# Patient Record
Sex: Female | Born: 2005 | Race: White | Hispanic: No | Marital: Single | Smoking: Never smoker
Health system: Southern US, Community
[De-identification: ages and names within clinical notes are randomized; demographics above are authoritative.]

---
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
## 2023-03-27 ENCOUNTER — Other Ambulatory Visit (HOSPITAL_BASED_OUTPATIENT_CLINIC_OR_DEPARTMENT_OTHER): Payer: Self-pay

## 2023-03-27 DIAGNOSIS — N946 Dysmenorrhea, unspecified: Secondary | ICD-10-CM | POA: Diagnosis not present

## 2023-03-27 DIAGNOSIS — Z30011 Encounter for initial prescription of contraceptive pills: Secondary | ICD-10-CM | POA: Diagnosis not present

## 2023-03-27 MED ORDER — LEVONORGEST-ETH ESTRAD 91-DAY 0.15-0.03 &0.01 MG PO TABS
1.0000 | ORAL_TABLET | Freq: Every day | ORAL | 4 refills | Status: AC
  Filled 2023-03-27: qty 91, 91d supply, fill #0

## 2023-03-28 ENCOUNTER — Other Ambulatory Visit (HOSPITAL_BASED_OUTPATIENT_CLINIC_OR_DEPARTMENT_OTHER): Payer: Self-pay

## 2023-05-05 ENCOUNTER — Other Ambulatory Visit (HOSPITAL_BASED_OUTPATIENT_CLINIC_OR_DEPARTMENT_OTHER): Payer: Self-pay

## 2023-05-05 MED ORDER — MEDROXYPROGESTERONE ACETATE 10 MG PO TABS
10.0000 mg | ORAL_TABLET | Freq: Every day | ORAL | 0 refills | Status: DC
  Filled 2023-05-05: qty 10, 10d supply, fill #0

## 2023-05-20 ENCOUNTER — Other Ambulatory Visit (HOSPITAL_BASED_OUTPATIENT_CLINIC_OR_DEPARTMENT_OTHER): Payer: Self-pay

## 2023-05-20 DIAGNOSIS — N939 Abnormal uterine and vaginal bleeding, unspecified: Secondary | ICD-10-CM | POA: Diagnosis not present

## 2023-05-20 MED ORDER — NORGESTIMATE-ETH ESTRADIOL 0.25-35 MG-MCG PO TABS
1.0000 | ORAL_TABLET | Freq: Every day | ORAL | 4 refills | Status: AC
  Filled 2023-05-20: qty 84, 84d supply, fill #0
  Filled 2023-07-09 – 2023-07-28 (×2): qty 84, 84d supply, fill #1
  Filled 2023-10-10 – 2023-10-13 (×2): qty 84, 84d supply, fill #2
  Filled 2024-04-12: qty 84, 84d supply, fill #3

## 2023-06-30 ENCOUNTER — Other Ambulatory Visit (HOSPITAL_BASED_OUTPATIENT_CLINIC_OR_DEPARTMENT_OTHER): Payer: Self-pay

## 2023-06-30 DIAGNOSIS — Z01419 Encounter for gynecological examination (general) (routine) without abnormal findings: Secondary | ICD-10-CM | POA: Diagnosis not present

## 2023-06-30 DIAGNOSIS — Z30011 Encounter for initial prescription of contraceptive pills: Secondary | ICD-10-CM | POA: Diagnosis not present

## 2023-06-30 DIAGNOSIS — N920 Excessive and frequent menstruation with regular cycle: Secondary | ICD-10-CM | POA: Diagnosis not present

## 2023-06-30 DIAGNOSIS — Z3040 Encounter for surveillance of contraceptives, unspecified: Secondary | ICD-10-CM | POA: Diagnosis not present

## 2023-06-30 MED ORDER — LEVONORGEST-ETH ESTRAD 91-DAY 0.15-0.03 &0.01 MG PO TABS
ORAL_TABLET | ORAL | 4 refills | Status: DC
  Filled 2023-06-30: qty 91, 90d supply, fill #0

## 2023-07-04 ENCOUNTER — Other Ambulatory Visit (HOSPITAL_BASED_OUTPATIENT_CLINIC_OR_DEPARTMENT_OTHER): Payer: Self-pay

## 2023-07-09 ENCOUNTER — Other Ambulatory Visit (HOSPITAL_BASED_OUTPATIENT_CLINIC_OR_DEPARTMENT_OTHER): Payer: Self-pay

## 2023-07-22 ENCOUNTER — Other Ambulatory Visit (HOSPITAL_BASED_OUTPATIENT_CLINIC_OR_DEPARTMENT_OTHER): Payer: Self-pay

## 2023-07-22 DIAGNOSIS — M25512 Pain in left shoulder: Secondary | ICD-10-CM | POA: Diagnosis not present

## 2023-07-22 MED ORDER — METHOCARBAMOL 500 MG PO TABS
500.0000 mg | ORAL_TABLET | Freq: Three times a day (TID) | ORAL | 0 refills | Status: AC
  Filled 2023-07-22: qty 60, 20d supply, fill #0

## 2023-07-22 MED ORDER — PREDNISONE 5 MG PO TABS
ORAL_TABLET | ORAL | 0 refills | Status: AC
  Filled 2023-07-22: qty 21, 12d supply, fill #0

## 2023-07-22 MED ORDER — MELOXICAM 15 MG PO TABS
15.0000 mg | ORAL_TABLET | Freq: Every day | ORAL | 0 refills | Status: DC
  Filled 2023-07-22: qty 30, 30d supply, fill #0

## 2023-07-24 ENCOUNTER — Other Ambulatory Visit (HOSPITAL_BASED_OUTPATIENT_CLINIC_OR_DEPARTMENT_OTHER): Payer: Self-pay

## 2023-07-24 DIAGNOSIS — N926 Irregular menstruation, unspecified: Secondary | ICD-10-CM | POA: Diagnosis not present

## 2023-07-24 MED ORDER — NORGESTIMATE-ETH ESTRADIOL 0.25-35 MG-MCG PO TABS
1.0000 | ORAL_TABLET | Freq: Every day | ORAL | 4 refills | Status: DC
  Filled 2023-07-24 – 2024-01-22 (×2): qty 84, 84d supply, fill #0
  Filled 2024-07-09: qty 84, 84d supply, fill #1

## 2023-07-24 MED ORDER — MEDROXYPROGESTERONE ACETATE 10 MG PO TABS
10.0000 mg | ORAL_TABLET | Freq: Every day | ORAL | 2 refills | Status: AC
  Filled 2023-07-24: qty 10, 10d supply, fill #0
  Filled 2023-10-20 – 2023-10-21 (×2): qty 10, 10d supply, fill #1

## 2023-07-25 ENCOUNTER — Other Ambulatory Visit (HOSPITAL_BASED_OUTPATIENT_CLINIC_OR_DEPARTMENT_OTHER): Payer: Self-pay

## 2023-07-28 ENCOUNTER — Other Ambulatory Visit (HOSPITAL_BASED_OUTPATIENT_CLINIC_OR_DEPARTMENT_OTHER): Payer: Self-pay

## 2023-07-29 DIAGNOSIS — Z0000 Encounter for general adult medical examination without abnormal findings: Secondary | ICD-10-CM | POA: Diagnosis not present

## 2023-08-11 DIAGNOSIS — M25512 Pain in left shoulder: Secondary | ICD-10-CM | POA: Diagnosis not present

## 2023-10-10 ENCOUNTER — Other Ambulatory Visit (HOSPITAL_BASED_OUTPATIENT_CLINIC_OR_DEPARTMENT_OTHER): Payer: Self-pay

## 2023-10-13 ENCOUNTER — Other Ambulatory Visit (HOSPITAL_BASED_OUTPATIENT_CLINIC_OR_DEPARTMENT_OTHER): Payer: Self-pay

## 2023-10-21 ENCOUNTER — Other Ambulatory Visit (HOSPITAL_BASED_OUTPATIENT_CLINIC_OR_DEPARTMENT_OTHER): Payer: Self-pay

## 2024-01-07 ENCOUNTER — Ambulatory Visit (INDEPENDENT_AMBULATORY_CARE_PROVIDER_SITE_OTHER)

## 2024-01-07 ENCOUNTER — Encounter: Payer: Self-pay | Admitting: Family Medicine

## 2024-01-07 ENCOUNTER — Other Ambulatory Visit (HOSPITAL_BASED_OUTPATIENT_CLINIC_OR_DEPARTMENT_OTHER): Payer: Self-pay

## 2024-01-07 ENCOUNTER — Ambulatory Visit: Admitting: Family Medicine

## 2024-01-07 VITALS — BP 108/72 | HR 74 | Ht 60.0 in | Wt 206.0 lb

## 2024-01-07 DIAGNOSIS — M25512 Pain in left shoulder: Secondary | ICD-10-CM

## 2024-01-07 DIAGNOSIS — M9901 Segmental and somatic dysfunction of cervical region: Secondary | ICD-10-CM

## 2024-01-07 DIAGNOSIS — M542 Cervicalgia: Secondary | ICD-10-CM | POA: Diagnosis not present

## 2024-01-07 DIAGNOSIS — M9903 Segmental and somatic dysfunction of lumbar region: Secondary | ICD-10-CM | POA: Diagnosis not present

## 2024-01-07 DIAGNOSIS — M9904 Segmental and somatic dysfunction of sacral region: Secondary | ICD-10-CM

## 2024-01-07 DIAGNOSIS — G8929 Other chronic pain: Secondary | ICD-10-CM | POA: Diagnosis not present

## 2024-01-07 DIAGNOSIS — M9902 Segmental and somatic dysfunction of thoracic region: Secondary | ICD-10-CM

## 2024-01-07 DIAGNOSIS — M9908 Segmental and somatic dysfunction of rib cage: Secondary | ICD-10-CM

## 2024-01-07 MED ORDER — GABAPENTIN 100 MG PO CAPS
100.0000 mg | ORAL_CAPSULE | Freq: Every day | ORAL | 0 refills | Status: AC
  Filled 2024-01-07: qty 90, 90d supply, fill #0

## 2024-01-07 MED ORDER — MELOXICAM 15 MG PO TABS
15.0000 mg | ORAL_TABLET | Freq: Every day | ORAL | 0 refills | Status: AC
  Filled 2024-01-07: qty 30, 30d supply, fill #0

## 2024-01-07 NOTE — Assessment & Plan Note (Signed)
 Pain over 3 months.  Could have more of a subacromial bursitis.  Do think some of it though is ergonomic.  Attempted osteopathic manipulation for more of a scapular dyskinesis as well.  Given exercises to strengthen the right upper back shoulder itself.  We discussed also the possibility of more of a radicular symptom that could be potentially contributing.  Patient has taken her mom's gabapentin that what has been helpful previously and given gabapentin as well as a oral anti-inflammatory.  Do feel that there are underlying possible hypermobility could also be contributing and may need to consider further evaluation with advanced imaging to see if a MR arthrogram which show any type of labral pathology.  Hopeful though that patient will have more time after finals in college and she will have more time to do some of the exercises and work on [employer].  Follow-up again in 6 to 8 weeks.

## 2024-01-07 NOTE — Assessment & Plan Note (Signed)
   Decision today to treat with OMT was based on Physical Exam  After verbal consent patient was treated with  ME, FPR techniques in cervical, thoracic, rib, lumbar and sacral areas, all areas are chronic   Patient tolerated the procedure well with improvement in symptoms  Patient given exercises, stretches and lifestyle modifications  See medications in patient instructions if given  Patient will follow up in 4-8 weeks

## 2024-01-07 NOTE — Patient Instructions (Addendum)
 Xray today Exercises Meloxicam 15mg  for 10 days stop if it hurts your stomach Gabapentin 100mg  at night Vit D 2000 IU Vit C 1000 See me again in 6-8 weeks

## 2024-01-07 NOTE — Progress Notes (Signed)
 [facility] [address] [place] [address] Phone: [phone] Subjective:   Bruce Donath, am serving as a scribe for Dr. Antoine Primas.  I'm seeing this patient by the request  of:  Assunta Found, MD  CC: Bilateral shoulder pain.  [id number]  Tonya Cobb is a 18 y.o. female coming in with complaint of B shoulder pain. Patient states that she has pain in neck, scapula, and on top of shoulders. L>R. Has numbness in the L forearm. Will have loss of strength when her pain is at its worst. Pain occurs at random. Did some PT through [facility]. Took mm relaxer and pain medication.      No past medical history on file. No past surgical history on file. Social History   Socioeconomic History   Marital status: Single    Spouse name: Not on file   Number of children: Not on file   Years of education: Not on file   Highest education level: Not on file  Occupational History   Not on file  Tobacco Use   Smoking status: Never   Smokeless tobacco: Never  Substance and Sexual Activity   Alcohol use: No    Alcohol/week: 0.0 standard drinks of alcohol   Drug use: No   Sexual activity: Not on file  Other Topics Concern   Not on file  Social History Narrative   Not on file   Social Drivers of Health   Financial Resource Strain: Not on file  Food Insecurity: Not on file  Transportation Needs: Not on file  Physical Activity: Not on file  Stress: Not on file  Social Connections: Not on file   No Known Allergies No family history on file.  Current Outpatient Medications (Endocrine & Metabolic):    dexamethasone (DECADRON) 4 MG tablet, Take 1 tablet (4 mg total) by mouth 3 (three) times daily.   Levonorgestrel-Ethinyl Estradiol (AMETHIA) 0.15-0.03 &0.01 MG tablet, Take 1 tablet by mouth daily.   medroxyPROGESTERone (PROVERA) 10 MG tablet, Take 1 tablet (10 mg total) by mouth daily for 10 days.   norgestimate-ethinyl estradiol (SPRINTEC  28) 0.25-35 MG-MCG tablet, Take 1 tablet by mouth daily.   norgestimate-ethinyl estradiol (VYLIBRA) 0.25-35 MG-MCG tablet, Take 1 tablet by mouth daily.   predniSONE (DELTASONE) 5 MG tablet, Take 1 tablet by mouth 3 times daily for 2 days,then 1 tablet twice daily for 5 days,then 1 tablet daily till finished    Current Outpatient Medications (Analgesics):    HYDROcodone-acetaminophen (NORCO/VICODIN) 5-325 MG tablet, Take 1 tablet by mouth every 6 (six) hours as needed for break through pain.   ibuprofen (ADVIL) 400 MG tablet, Take 1 tablet (400 mg total) by mouth every 4 (four) hours as needed for pain.   meloxicam (MOBIC) 15 MG tablet, Take 1 tablet (15 mg total) by mouth daily.   Current Outpatient Medications (Other):    amoxicillin (AMOXIL) 500 MG capsule, Take 1 capsule (500 mg total) by mouth 3 (three) times daily.   amoxicillin (AMOXIL) 500 MG capsule, Take 1 capsule (500 mg total) by mouth 3 (three) times daily for 7 days.   chlorhexidine (PERIDEX) 0.12 % solution, Swish for 30 seconds and spit twice daily as directed.   methocarbamol (ROBAXIN) 500 MG tablet, Take 1 tablet (500 mg total) by mouth 3 (three) times daily.   Reviewed prior external information including notes and imaging from  primary care provider As well as notes that were available from care everywhere and  other healthcare systems.  Past medical history, social, surgical and family history all reviewed in electronic medical record.  No pertanent information unless stated regarding to the chief complaint.   Review of Systems:  No headache, visual changes, nausea, vomiting, diarrhea, constipation, dizziness, abdominal pain, skin rash, fevers, chills, night sweats, weight loss, swollen lymph nodes, body aches, joint swelling, chest pain, shortness of breath, mood changes. POSITIVE muscle aches  Objective  There were no vitals taken for this visit.   General: No apparent distress alert and oriented x3 mood and  affect normal, dressed appropriately.  HEENT: Pupils equal, extraocular movements intact  Respiratory: Patient's speak in full sentences and does not appear short of breath  Cardiovascular: No lower extremity edema, non tender, no erythema  Left shoulder seems to give her more discomfort.  Positive impingement noted.  Patient does have pain with internal and external range of motion of the shoulder.  No significant weakness noted.  Mild discomfort with palpation over the acromioclavicular joint. Hypermobility noted.  Osteopathic findings  C5 flexed rotated and side bent left T3 extended rotated and side bent left inhaled third rib T9 extended rotated and side bent left L2 flexed rotated and side bent right Sacrum right on right     Impression and Recommendations:     The above documentation has been reviewed and is accurate and complete Judi Saa, DO

## 2024-01-22 ENCOUNTER — Other Ambulatory Visit (HOSPITAL_BASED_OUTPATIENT_CLINIC_OR_DEPARTMENT_OTHER): Payer: Self-pay

## 2024-02-03 ENCOUNTER — Telehealth: Admitting: Family Medicine

## 2024-02-03 ENCOUNTER — Encounter

## 2024-02-03 DIAGNOSIS — J029 Acute pharyngitis, unspecified: Secondary | ICD-10-CM

## 2024-02-03 MED ORDER — AMOXICILLIN 875 MG PO TABS: 875.0000 mg | ORAL_TABLET | Freq: Two times a day (BID) | ORAL | 0 refills | Status: AC

## 2024-02-03 NOTE — Progress Notes (Signed)
 Message sent to patient requesting further input regarding current symptoms. Awaiting patient response.

## 2024-02-03 NOTE — Progress Notes (Signed)
 E-Visit for Sore Throat - Strep Symptoms  We are sorry that you are not feeling well.  Here is how we plan to help!  Based on what you have shared with me it is likely that you have strep pharyngitis.  Strep pharyngitis is inflammation and infection in the back of the throat.  This is an infection cause by bacteria and is treated with antibiotics.  I have prescribed Amoxicillin 500 mg twice a day for 10 days. For throat pain, we recommend over the counter oral pain relief medications such as acetaminophen or aspirin, or anti-inflammatory medications such as ibuprofen or naproxen sodium. Topical treatments such as oral throat lozenges or sprays may be used as needed. Strep infections are not as easily transmitted as other respiratory infections, however we still recommend that you avoid close contact with loved ones, especially the very young and elderly.  Remember to wash your hands thoroughly throughout the day as this is the number one way to prevent the spread of infection and wipe down door knobs and counters with disinfectant.   Home Care: Only take medications as instructed by your medical team. Complete the entire course of an antibiotic. Do not take these medications with alcohol. A steam or ultrasonic humidifier can help congestion.  You can place a towel over your head and breathe in the steam from hot water coming from a faucet. Avoid close contacts especially the very young and the elderly. Cover your mouth when you cough or sneeze. Always remember to wash your hands.  Get Help Right Away If: You develop worsening fever or sinus pain. You develop a severe head ache or visual changes. Your symptoms persist after you have completed your treatment plan.  Make sure you Understand these instructions. Will watch your condition. Will get help right away if you are not doing well or get worse.   Thank you for choosing an e-visit.  Your e-visit answers were reviewed by a board  certified advanced clinical practitioner to complete your personal care plan. Depending upon the condition, your plan could have included both over the counter or prescription medications.  Please review your pharmacy choice. Make sure the pharmacy is open so you can pick up prescription now. If there is a problem, you may contact your provider through [employer] and have the prescription routed to another pharmacy.  Your safety is important to [place]. If you have drug allergies check your prescription carefully.   For the next 24 hours you can use MyChart to ask questions about today's visit, request a non-urgent call back, or ask for a work or school excuse. You will get an email in the next two days asking about your experience. I hope that your e-visit has been valuable and will speed your recovery.    have provided 5 minutes of non face to face time during this encounter for chart review and documentation.

## 2024-02-12 ENCOUNTER — Ambulatory Visit: Admitting: Family Medicine

## 2024-02-20 NOTE — Progress Notes (Signed)
  [facility] [address] [place] [address] Phone: [phone] Subjective:   Tonya Cobb, am serving as a scribe for Dr. Ronnell Coins.  I'm seeing this patient by the request  of:  Minus Amel, MD  CC: Upper back pain  [id number]  Tonya Cobb is a 18 y.o. female coming in with complaint of back and neck pain. OMT on 01/07/2024. Patient states that she is in the same amount of pain as last visit. Pain scapula, neck and traps.  Continues to have pain on a daily basis.  Continuing to affect daily activity as well as nighttime.  Radicular symptoms going down the arm which makes certain things more difficult as well.  Medications patient has been prescribed: gabapentin  mobic   Taking:         Reviewed prior external information including notes and imaging from previsou exam, outside providers and external EMR if available.   As well as notes that were available from care everywhere and other healthcare systems.  Past medical history, social, surgical and family history all reviewed in electronic medical record.  No pertanent information unless stated regarding to the chief complaint.   No past medical history on file.  No Known Allergies   Review of Systems:  No headache, visual changes, nausea, vomiting, diarrhea, constipation, dizziness, abdominal pain, skin rash, fevers, chills, night sweats, weight loss, swollen lymph nodes, body aches, joint swelling, chest pain, shortness of breath, mood changes. POSITIVE muscle aches  Objective  Blood pressure 120/72, pulse 91, height 5' (1.524 m), weight 205 lb (93 kg), SpO2 98%.   General: No apparent distress alert and oriented x3 mood and affect normal, dressed appropriately.  HEENT: Pupils equal, extraocular movements intact  Respiratory: Patient's speak in full sentences and does not appear short of breath  Cardiovascular: No lower extremity edema, non tender, no erythema   Positive  Spurling's noted.  Patient does have radicular symptoms down the left arm with this.  Seems to be in the C7 distribution.      Assessment and Plan:  Chronic left shoulder pain Continues to have chronic pain at the moment.  Seems to be out of proportion.  Have not done some different x-rays over the course of time.  Having radiation down the arms bilaterally.  Discussed with patient about icing regimen and home exercises, which activities to do and which ones to avoid.  Increase activity slowly.  Discussed icing regimen.  Follow-up with me again in 6 to 8 weeks otherwise.   Positive Spurling's noted as well, has not responded well to the formal physical therapy, home exercises program, osteopathic manipulation.  Will get laboratory workup to further evaluate if anything else is contributing.  Also MRI of the cervical spine to see if there is any nerve root impingement that could be contributing. He has failed all conservative therapy and this has been going on for a while greater than 6 months.   The above documentation has been reviewed and is accurate and complete Gaspard Isbell M Kelon Easom, DO          Note: This dictation was prepared with Dragon dictation along with smaller phrase technology. Any transcriptional errors that result from this process are unintentional.

## 2024-02-24 ENCOUNTER — Ambulatory Visit: Admitting: Family Medicine

## 2024-02-24 ENCOUNTER — Other Ambulatory Visit: Payer: Self-pay

## 2024-02-24 VITALS — BP 120/72 | HR 91 | Ht 60.0 in | Wt 205.0 lb

## 2024-02-24 DIAGNOSIS — M25512 Pain in left shoulder: Secondary | ICD-10-CM | POA: Diagnosis not present

## 2024-02-24 DIAGNOSIS — M2550 Pain in unspecified joint: Secondary | ICD-10-CM

## 2024-02-24 DIAGNOSIS — M542 Cervicalgia: Secondary | ICD-10-CM

## 2024-02-24 DIAGNOSIS — G8929 Other chronic pain: Secondary | ICD-10-CM | POA: Diagnosis not present

## 2024-02-24 LAB — CBC WITH DIFFERENTIAL/PLATELET
Basophils Absolute: 0.1 10*3/uL (ref 0.0–0.1)
Basophils Relative: 0.8 % (ref 0.0–3.0)
Eosinophils Absolute: 0.1 10*3/uL (ref 0.0–0.7)
Eosinophils Relative: 1.3 % (ref 0.0–5.0)
HCT: 36.3 % (ref 36.0–46.0)
Hemoglobin: 12.1 g/dL (ref 12.0–15.0)
Lymphocytes Relative: 22.5 % (ref 12.0–46.0)
Lymphs Abs: 1.9 10*3/uL (ref 0.7–4.0)
MCHC: 33.3 g/dL (ref 30.0–36.0)
MCV: 88.5 fl (ref 78.0–100.0)
Monocytes Absolute: 0.6 10*3/uL (ref 0.1–1.0)
Monocytes Relative: 6.7 % (ref 3.0–12.0)
Neutro Abs: 5.8 10*3/uL (ref 1.4–7.7)
Neutrophils Relative %: 68.7 % (ref 43.0–77.0)
Platelets: 306 10*3/uL (ref 150.0–400.0)
RBC: 4.11 Mil/uL (ref 3.87–5.11)
RDW: 12.5 % (ref 11.5–14.6)
WBC: 8.5 10*3/uL (ref 4.5–10.5)

## 2024-02-24 LAB — SEDIMENTATION RATE: Sed Rate: 7 mm/h (ref 0–20)

## 2024-02-24 NOTE — Assessment & Plan Note (Signed)
 Continues to have chronic pain at the moment.  Seems to be out of proportion.  Have not done some different x-rays over the course of time.  Having radiation down the arms bilaterally.  Discussed with patient about icing regimen and home exercises, which activities to do and which ones to avoid.  Increase activity slowly.  Discussed icing regimen.  Follow-up with me again in 6 to 8 weeks otherwise.

## 2024-02-24 NOTE — Patient Instructions (Addendum)
 MRI cervical [id number] Labs today We will be in touch

## 2024-02-25 ENCOUNTER — Encounter: Payer: Self-pay | Admitting: Family Medicine

## 2024-02-25 ENCOUNTER — Ambulatory Visit: Payer: Self-pay | Admitting: Family Medicine

## 2024-02-25 LAB — COMPREHENSIVE METABOLIC PANEL WITH GFR
ALT: 13 U/L (ref 0–35)
AST: 13 U/L (ref 0–37)
Albumin: 4.4 g/dL (ref 3.5–5.2)
Alkaline Phosphatase: 47 U/L (ref 39–117)
BUN: 12 mg/dL (ref 6–23)
CO2: 24 meq/L (ref 19–32)
Calcium: 9.5 mg/dL (ref 8.4–10.5)
Chloride: 104 meq/L (ref 96–112)
Creatinine, Ser: 0.93 mg/dL (ref 0.40–1.20)
GFR: 88.56 mL/min (ref >60.00)
Glucose, Bld: 94 mg/dL (ref 70–99)
Potassium: 3.9 meq/L (ref 3.5–5.1)
Sodium: 139 meq/L (ref 135–145)
Total Bilirubin: 0.4 mg/dL (ref 0.2–1.2)
Total Protein: 7.5 g/dL (ref 6.0–8.3)

## 2024-02-25 LAB — TSH: TSH: 0.84 u[IU]/mL (ref 0.35–5.50)

## 2024-02-25 LAB — IBC PANEL
Iron: 100 ug/dL (ref 42–145)
Saturation Ratios: 20.6 % (ref 20.0–50.0)
TIBC: 484.4 ug/dL — ABNORMAL HIGH (ref 250.0–450.0)
Transferrin: 346 mg/dL (ref 212.0–360.0)

## 2024-02-25 LAB — FERRITIN: Ferritin: 20.1 ng/mL (ref 10.0–291.0)

## 2024-02-25 LAB — VITAMIN D 25 HYDROXY (VIT D DEFICIENCY, FRACTURES): VITD: 24.67 ng/mL — ABNORMAL LOW (ref 30.00–100.00)

## 2024-02-25 LAB — C-REACTIVE PROTEIN: CRP: <1 mg/dL (ref 0.5–20.0)

## 2024-02-25 LAB — URIC ACID: Uric Acid, Serum: 4.7 mg/dL (ref 2.4–7.0)

## 2024-02-25 LAB — VITAMIN B12: Vitamin B-12: 398 pg/mL (ref 211–911)

## 2024-02-26 LAB — RHEUMATOID FACTOR: Rheumatoid fact SerPl-aCnc: <10 [IU]/mL (ref <14)

## 2024-02-26 LAB — PTH, INTACT AND CALCIUM
Calcium: 9.6 mg/dL (ref 8.6–10.2)
PTH: 25 pg/mL (ref 16–77)

## 2024-02-26 LAB — ANA: Anti Nuclear Antibody (ANA): NEGATIVE

## 2024-02-26 LAB — CYCLIC CITRUL PEPTIDE ANTIBODY, IGG: Cyclic Citrullin Peptide Ab: <16 U

## 2024-02-26 LAB — ANGIOTENSIN CONVERTING ENZYME: Angiotensin-Converting Enzyme: 25 U/L (ref 9–67)

## 2024-02-26 LAB — CALCIUM, IONIZED: Calcium, Ion: 5.2 mg/dL (ref 4.7–5.5)

## 2024-03-08 ENCOUNTER — Ambulatory Visit
Admission: RE | Admit: 2024-03-08 | Discharge: 2024-03-08 | Disposition: A | Discharge status: Home / Self Care | Source: Ambulatory Visit | Attending: Family Medicine | Admitting: Family Medicine

## 2024-03-08 DIAGNOSIS — M5412 Radiculopathy, cervical region: Secondary | ICD-10-CM | POA: Diagnosis not present

## 2024-03-08 DIAGNOSIS — M542 Cervicalgia: Secondary | ICD-10-CM

## 2024-03-08 DIAGNOSIS — R200 Anesthesia of skin: Secondary | ICD-10-CM | POA: Diagnosis not present

## 2024-09-27 ENCOUNTER — Other Ambulatory Visit (HOSPITAL_BASED_OUTPATIENT_CLINIC_OR_DEPARTMENT_OTHER): Payer: Self-pay

## 2024-09-27 DIAGNOSIS — Z01419 Encounter for gynecological examination (general) (routine) without abnormal findings: Secondary | ICD-10-CM | POA: Diagnosis not present

## 2024-09-27 DIAGNOSIS — Z3041 Encounter for surveillance of contraceptive pills: Secondary | ICD-10-CM | POA: Diagnosis not present

## 2024-09-27 MED ORDER — NORGESTIMATE-ETH ESTRADIOL 0.25-35 MG-MCG PO TABS
1.0000 | ORAL_TABLET | Freq: Every day | ORAL | 4 refills | Status: AC
  Filled 2024-09-27: qty 84, 84d supply, fill #0

## 2024-09-28 ENCOUNTER — Other Ambulatory Visit (HOSPITAL_BASED_OUTPATIENT_CLINIC_OR_DEPARTMENT_OTHER): Payer: Self-pay

## 2024-09-28 MED ORDER — NORGESTIMATE-ETH ESTRADIOL 0.25-35 MG-MCG PO TABS
1.0000 | ORAL_TABLET | Freq: Every day | ORAL | 4 refills | Status: AC
  Filled 2024-09-28: qty 84, 84d supply, fill #0
